# Patient Record
Sex: Male | Born: 1935 | Race: White | Hispanic: No | Marital: Married
Health system: Southern US, Community
[De-identification: ages and names within clinical notes are randomized; demographics above are authoritative.]

---

## 2009-01-11 ENCOUNTER — Emergency Department: Payer: Self-pay | Admitting: Emergency Medicine

## 2012-01-25 ENCOUNTER — Ambulatory Visit: Payer: Self-pay | Admitting: Ophthalmology

## 2012-10-11 ENCOUNTER — Ambulatory Visit: Payer: Self-pay | Admitting: Family Medicine

## 2013-03-02 ENCOUNTER — Ambulatory Visit: Payer: Self-pay

## 2013-03-02 ENCOUNTER — Emergency Department: Payer: Self-pay | Admitting: Emergency Medicine

## 2013-03-02 LAB — URINALYSIS, COMPLETE
Blood: NEGATIVE
Nitrite: NEGATIVE
Ph: 7 (ref 4.5–8.0)
RBC,UR: 1 /HPF (ref 0–5)
Specific Gravity: 1.005 (ref 1.003–1.030)
Squamous Epithelial: <1
WBC UR: 2 /HPF (ref 0–5)

## 2013-03-02 LAB — CBC
HGB: 14.4 g/dL (ref 13.0–18.0)
MCH: 31.4 pg (ref 26.0–34.0)
MCHC: 35.3 g/dL (ref 32.0–36.0)
MCV: 89 fL (ref 80–100)
Platelet: 172 10*3/uL (ref 150–440)
RBC: 4.58 10*6/uL (ref 4.40–5.90)

## 2013-03-02 LAB — BASIC METABOLIC PANEL
BUN: 15 mg/dL (ref 7–18)
Chloride: 107 mmol/L (ref 98–107)
Co2: 34 mmol/L — ABNORMAL HIGH (ref 21–32)
EGFR (African American): >60
EGFR (Non-African Amer.): >60
Glucose: 88 mg/dL (ref 65–99)
Osmolality: 282 (ref 275–301)
Potassium: 4.1 mmol/L (ref 3.5–5.1)

## 2013-03-02 LAB — TROPONIN I: Troponin-I: <0.02 ng/mL

## 2013-11-20 ENCOUNTER — Inpatient Hospital Stay: Payer: Self-pay | Admitting: Internal Medicine

## 2013-11-20 LAB — BASIC METABOLIC PANEL
Anion Gap: 5 — ABNORMAL LOW (ref 7–16)
BUN: 18 mg/dL (ref 7–18)
CALCIUM: 8.3 mg/dL — AB (ref 8.5–10.1)
CREATININE: 1.2 mg/dL (ref 0.60–1.30)
Chloride: 108 mmol/L — ABNORMAL HIGH (ref 98–107)
Co2: 30 mmol/L (ref 21–32)
EGFR (Non-African Amer.): 56 — ABNORMAL LOW
Glucose: 90 mg/dL (ref 65–99)
Osmolality: 286 (ref 275–301)
Potassium: 3.9 mmol/L (ref 3.5–5.1)
SODIUM: 143 mmol/L (ref 136–145)

## 2013-11-20 LAB — CBC WITH DIFFERENTIAL/PLATELET
Basophil #: 0.1 10*3/uL (ref 0.0–0.1)
Basophil %: 0.7 %
Eosinophil #: 0.1 10*3/uL (ref 0.0–0.7)
Eosinophil %: 0.8 %
HCT: 40.3 % (ref 40.0–52.0)
HGB: 13.5 g/dL (ref 13.0–18.0)
Lymphocyte #: 1.2 10*3/uL (ref 1.0–3.6)
Lymphocyte %: 13.1 %
MCH: 30.5 pg (ref 26.0–34.0)
MCHC: 33.6 g/dL (ref 32.0–36.0)
MCV: 91 fL (ref 80–100)
MONO ABS: 0.6 x10 3/mm (ref 0.2–1.0)
MONOS PCT: 6.9 %
Neutrophil #: 7 10*3/uL — ABNORMAL HIGH (ref 1.4–6.5)
Neutrophil %: 78.5 %
PLATELETS: 158 10*3/uL (ref 150–440)
RBC: 4.44 10*6/uL (ref 4.40–5.90)
RDW: 13.2 % (ref 11.5–14.5)
WBC: 8.9 10*3/uL (ref 3.8–10.6)

## 2013-11-20 LAB — HEPATIC FUNCTION PANEL A (ARMC)
AST: 21 U/L (ref 15–37)
Albumin: 3.3 g/dL — ABNORMAL LOW (ref 3.4–5.0)
Alkaline Phosphatase: 79 U/L
BILIRUBIN DIRECT: 0.1 mg/dL (ref 0.00–0.20)
BILIRUBIN TOTAL: 0.3 mg/dL (ref 0.2–1.0)
SGPT (ALT): 16 U/L (ref 12–78)
Total Protein: 6.2 g/dL — ABNORMAL LOW (ref 6.4–8.2)

## 2013-11-20 LAB — URINALYSIS, COMPLETE
BILIRUBIN, UR: NEGATIVE
BLOOD: NEGATIVE
Bacteria: NONE SEEN
GLUCOSE, UR: NEGATIVE mg/dL (ref 0–75)
KETONE: NEGATIVE
LEUKOCYTE ESTERASE: NEGATIVE
Nitrite: NEGATIVE
PROTEIN: NEGATIVE
Ph: 5 (ref 4.5–8.0)
RBC,UR: 1 /HPF (ref 0–5)
SPECIFIC GRAVITY: 1.021 (ref 1.003–1.030)
Squamous Epithelial: NONE SEEN

## 2013-11-20 LAB — TROPONIN I: Troponin-I: <0.02 ng/mL

## 2013-11-21 LAB — CBC WITH DIFFERENTIAL/PLATELET
BASOS ABS: 0 10*3/uL (ref 0.0–0.1)
Basophil %: 0.5 %
EOS ABS: 0.1 10*3/uL (ref 0.0–0.7)
Eosinophil %: 1.5 %
HCT: 41.4 % (ref 40.0–52.0)
HGB: 14.2 g/dL (ref 13.0–18.0)
Lymphocyte #: 1.5 10*3/uL (ref 1.0–3.6)
Lymphocyte %: 17.3 %
MCH: 30.8 pg (ref 26.0–34.0)
MCHC: 34.4 g/dL (ref 32.0–36.0)
MCV: 89 fL (ref 80–100)
Monocyte #: 0.6 x10 3/mm (ref 0.2–1.0)
Monocyte %: 7.2 %
NEUTROS PCT: 73.5 %
Neutrophil #: 6.2 10*3/uL (ref 1.4–6.5)
PLATELETS: 162 10*3/uL (ref 150–440)
RBC: 4.63 10*6/uL (ref 4.40–5.90)
RDW: 13.3 % (ref 11.5–14.5)
WBC: 8.5 10*3/uL (ref 3.8–10.6)

## 2013-11-21 LAB — BASIC METABOLIC PANEL
ANION GAP: 5 — AB (ref 7–16)
BUN: 15 mg/dL (ref 7–18)
CO2: 29 mmol/L (ref 21–32)
Calcium, Total: 8.3 mg/dL — ABNORMAL LOW (ref 8.5–10.1)
Chloride: 107 mmol/L (ref 98–107)
Creatinine: 0.89 mg/dL (ref 0.60–1.30)
EGFR (African American): >60
EGFR (Non-African Amer.): >60
Glucose: 84 mg/dL (ref 65–99)
Osmolality: 281 (ref 275–301)
POTASSIUM: 3.3 mmol/L — AB (ref 3.5–5.1)
Sodium: 141 mmol/L (ref 136–145)

## 2013-11-21 LAB — MAGNESIUM: Magnesium: 2 mg/dL

## 2013-11-21 LAB — LIPID PANEL
CHOLESTEROL: 143 mg/dL (ref 0–200)
HDL: 41 mg/dL (ref 40–60)
LDL CHOLESTEROL, CALC: 88 mg/dL (ref 0–100)
Triglycerides: 69 mg/dL (ref 0–200)
VLDL Cholesterol, Calc: 14 mg/dL (ref 5–40)

## 2013-11-21 LAB — TSH: Thyroid Stimulating Horm: 1.5 u[IU]/mL

## 2014-01-06 ENCOUNTER — Emergency Department: Payer: Self-pay | Admitting: Emergency Medicine

## 2014-01-06 LAB — URINALYSIS, COMPLETE
BACTERIA: NONE SEEN
BLOOD: NEGATIVE
Bilirubin,UR: NEGATIVE
Glucose,UR: NEGATIVE mg/dL (ref 0–75)
Hyaline Cast: 4
LEUKOCYTE ESTERASE: NEGATIVE
NITRITE: NEGATIVE
PH: 5 (ref 4.5–8.0)
PROTEIN: NEGATIVE
SPECIFIC GRAVITY: 1.025 (ref 1.003–1.030)
Squamous Epithelial: 1

## 2014-01-06 LAB — CBC
HCT: 41.2 % (ref 40.0–52.0)
HGB: 13.4 g/dL (ref 13.0–18.0)
MCH: 29.4 pg (ref 26.0–34.0)
MCHC: 32.6 g/dL (ref 32.0–36.0)
MCV: 90 fL (ref 80–100)
Platelet: 184 10*3/uL (ref 150–440)
RBC: 4.58 10*6/uL (ref 4.40–5.90)
RDW: 13.6 % (ref 11.5–14.5)
WBC: 8 10*3/uL (ref 3.8–10.6)

## 2014-01-06 LAB — BASIC METABOLIC PANEL
Anion Gap: 1 — ABNORMAL LOW (ref 7–16)
BUN: 21 mg/dL — ABNORMAL HIGH (ref 7–18)
CALCIUM: 8.4 mg/dL — AB (ref 8.5–10.1)
CO2: 32 mmol/L (ref 21–32)
CREATININE: 1.35 mg/dL — AB (ref 0.60–1.30)
Chloride: 110 mmol/L — ABNORMAL HIGH (ref 98–107)
EGFR (African American): 57 — ABNORMAL LOW
GFR CALC NON AF AMER: 49 — AB
GLUCOSE: 85 mg/dL (ref 65–99)
Osmolality: 287 (ref 275–301)
Potassium: 3.9 mmol/L (ref 3.5–5.1)
Sodium: 143 mmol/L (ref 136–145)

## 2014-10-10 DEATH — deceased

## 2014-11-01 NOTE — Discharge Summary (Signed)
PATIENT NAME:  Nathan Wheeler, Nathan Wheeler MR#:  [id number] DATE OF BIRTH:  [birth year]  DATE OF ADMISSION:  11/20/2013  DATE OF DISCHARGE:  11/21/2013  PRIMARY CARE PHYSICIAN:  Dr. Quillian QuinceBliss  DISCHARGE DIAGNOSES:  1.  Frequent falls. 2.  Parkinson's disease.  3.  Hypertension.  4.  Dementia.   CONDITION: Stable.   CODE STATUS: Full code.   MEDICATIONS: Please refer to the medication reconciliation list.   DIET: Low sodium diet.   ACTIVITY: As tolerated.   FOLLOWUP CARE: Follow up with PCP visit in 1 to 2 weeks, and fall and aspiration precautions.   REASON FOR ADMISSION: Frequent falls.   HOSPITAL COURSE: The patient is an 81 year old Caucasian male with Wheeler history of Lewy body dementia, hypertension, CAD, Parkinson's disease, who was sent from home to ED due to frequent falls recently. The patient's CAT scan of head did not show any acute changes. No obvious fracture of cervical spine. The patient's labs are normal. For detailed history and physical examination, please refer to the admission note dictated by Dr. Heron NayVasireddy. After admission, the patient had Wheeler physical therapy evaluation. I discuss with the social worker and case Production designer, theatre/television/filmmanager. They suggested patient may be discharged to home today, and then will get Wheeler skilled nursing facility from home. The patient's wife ([id number]) agreed. The patient is stable and will be discharged to home today. I discussed the patient's discharge plan with the patient's wife, nurse, case Production designer, theatre/television/filmmanager, and Child psychotherapistsocial worker.   TIME SPENT: About 42 minutes.   ____________________________ Shaune PollackQing Anabia Weatherwax, MD qc:mr D: 11/21/2013 17:21:00 ET T: 11/21/2013 19:18:05 ET JOB#: [id number]  cc: Shaune PollackQing Aneya Daddona, MD, <Dictator> Shaune PollackQING Woods Gangemi MD ELECTRONICALLY SIGNED 11/22/2013 14:52

## 2014-11-01 NOTE — H&P (Signed)
PATIENT NAME:  Nathan Wheeler, Nathan Wheeler MR#:  [id number] DATE OF BIRTH:  [birth year]  DATE OF ADMISSION:  11/21/2013  PRIMARY CARE PHYSICIAN: Dr. Temple Pacini.   REFERRING PHYSICIAN: Dr. Ella Bodo.   CHIEF COMPLAINT: Frequent falls.   HISTORY OF PRESENT ILLNESS: Nathan Wheeler is an 81 year old male with history of Lewy body dementia, hypertension, Parkinson disease. He is brought to the Emergency Department with complaints of frequent falls. The patient usually falls once in 1 to 2 weeks. It has become every week, and in the last few days, the patient has been falling 2 to 3 times a day. The patient's wife is unable to provide any care for this patient. The patient's wife was not present at the time of my interview. Discussed with the Emergency Department physician. Expressed interest in placing in a skilled nursing facility. The patient denied having any chest pain, palpitations, nausea, vomiting, abdominal pain currently. Knows that this a hospital; however, did not know the reason of the ER visit. Workup in the Emergency Department: CBC, CMP, cardiac enzymes, urinalysis are completely within normal limits. CT head without contrast shows diffuse cerebral atrophy. No obvious fractures of the cervical spine.   PAST MEDICAL HISTORY:  1. Lewy body dementia.  2. Hypertension.  3. Coronary artery disease, status post stent placement.  4. Fusion of the bilateral ankles.  5. Bilateral knee replacement.    ALLERGIES:  1. PENICILLIN.  2. TETANUS.   3. PERCOCET.  4. OXYCONTIN.   HOME MEDICATIONS:  1. .  2. Proscar.  3. .   4. Omeprazole.  5. Florinef.   6. Aricept.   SOCIAL HISTORY: No history of smoking, drinking alcohol or using illicit drugs. Married, lives with his wife.   FAMILY HISTORY: Could not be obtained from the patient.   REVIEW OF SYSTEMS: Could not be obtained from the patient secondary to dementia.   PHYSICAL EXAMINATION:  GENERAL: This is a well-built, well-nourished, age-appropriate  male lying down in the bed, not in distress.  VITAL SIGNS: Temperature 97.7, pulse 60, blood pressure 156/95, respiratory rate of 18, oxygen saturation is 97% on room air.  HEENT: Head normocephalic, atraumatic. There is no scleral icterus. Conjunctivae normal. Pupils equal and react to light. Could not examine the extraocular movements. Mucous membranes moist. No pharyngeal erythema.  NECK: Supple. No lymphadenopathy. No JVD. No carotid bruit. No thyromegaly.  CHEST: Has no focal tenderness.  LUNGS: Bilaterally clear to auscultation.  HEART: S1, S2 regular. No murmurs are heard.  ABDOMEN: Bowel sounds present. Soft, nontender, nondistended. No hepatosplenomegaly.  EXTREMITIES: No pedal edema. Pulses 2+.  SKIN: No rash or lesions.  MUSCULOSKELETAL: Good range of motion in all of the extremities.  NEUROLOGIC: The patient is alert, oriented to self. [name] this is a hospital. Did not remember the name of the hospital. Was able to tell his date of birth. Not oriented to time and date. Cranial nerves II through XII intact. Motor 5/5 in upper and lower extremities.   ASSESSMENT AND PLAN: [name] is an 81 year old male with a history of advanced dementia secondary to Lewy body dementia. Comes to the Emergency Department with frequent falls.  1. Frequent falls: Concern about the patient's balance secondary to Parkinson disease. The patient is not on any anti-Parkinson disease . The patient does not have any resting tremor. Did not see much rigidity; however, will admit the patient and obtain physical therapy, occupational therapy evaluation. Will also get case management consult for possible placement.  2.  to be progressive with worsening of the symptoms.  3. Hypertension: Currently well controlled. He is not on any medications.  4. Keep the patient on deep vein thrombosis prophylaxis with Lovenox.   TIME SPENT: 45 minutes.   ____________________________ Susa GriffinsPadmaja Cylas Falzone, MD pv:gb D: 11/20/2013  23:57:07 ET T: 11/21/2013 00:25:32 ET JOB#: [id number]  cc: Susa GriffinsPadmaja Alvis Edgell, MD, <Dictator> Burley SaverL. Katherine Bliss, MD Susa GriffinsPADMAJA Najah Liverman MD ELECTRONICALLY SIGNED 12/06/2013 0:36

## 2015-12-18 IMAGING — CT CT CERVICAL SPINE WITHOUT CONTRAST
2 of 3 series · 13 of 27 positions shown, 16 images · non-contrast
Comparison: DG CHEST 2V dated 11/20/2013

CLINICAL DATA: Fell tonight at time. No loss of consciousness. No
obvious injuries.

EXAM:
CT HEAD WITHOUT CONTRAST
CT CERVICAL SPINE WITHOUT CONTRAST
TECHNIQUE: Multidetector CT imaging of the head and cervical spine was
performed following the standard protocol without intravenous
contrast. Multiplanar CT image reconstructions of the cervical spine
were also generated.

[Series 5: soft tissue · axial · 0.39mm/px · z∈[-141,-5]mm · 8 of 88 slices shown, 10 images]
[im 10/88  soft-tissue]
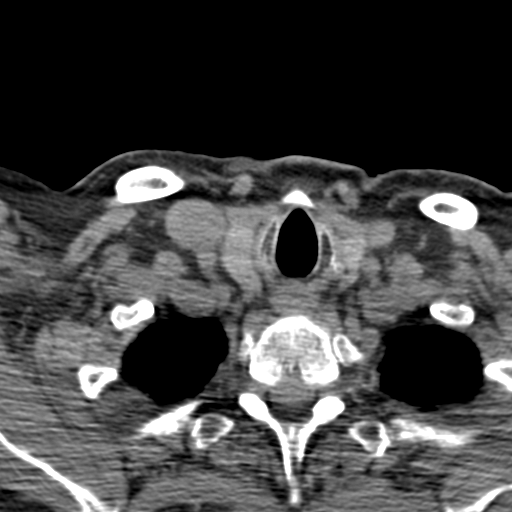
[im 10/88  bone]
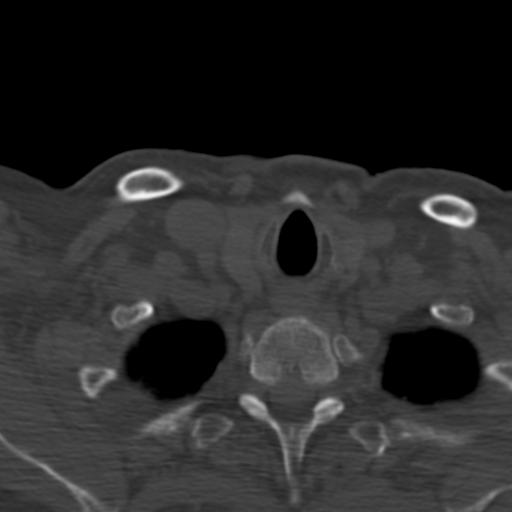
[im 20/88  bone]
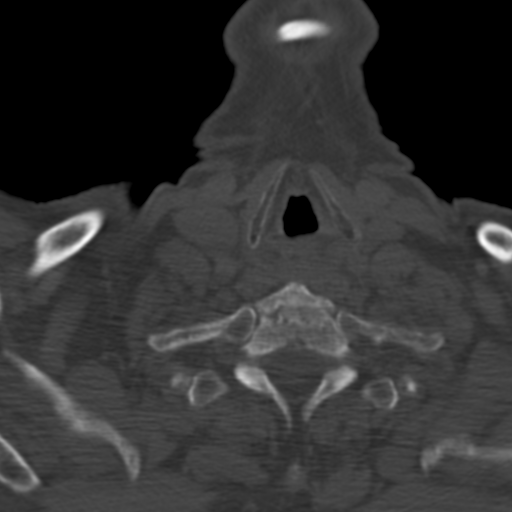
[im 30/88  bone]
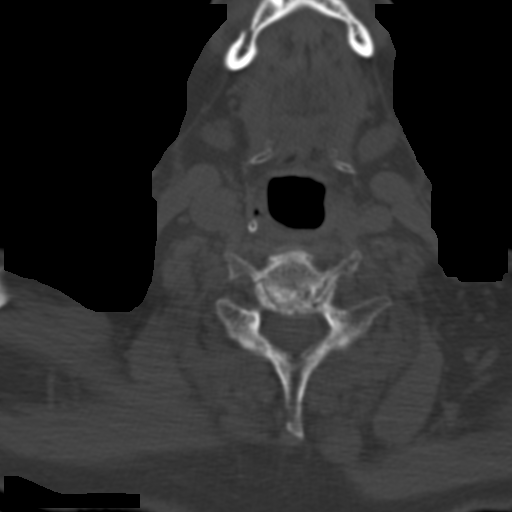
[im 39/88  bone]
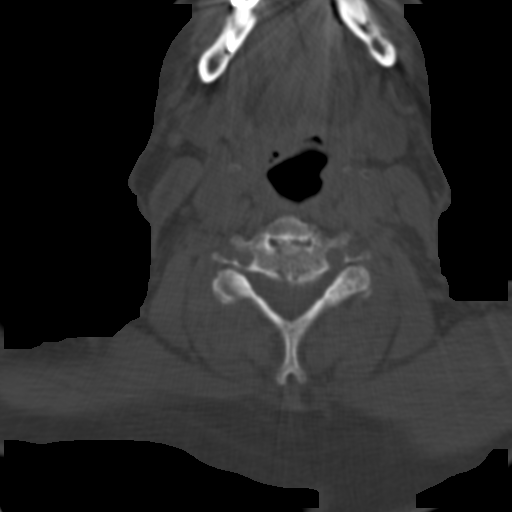
[im 49/88  soft-tissue]
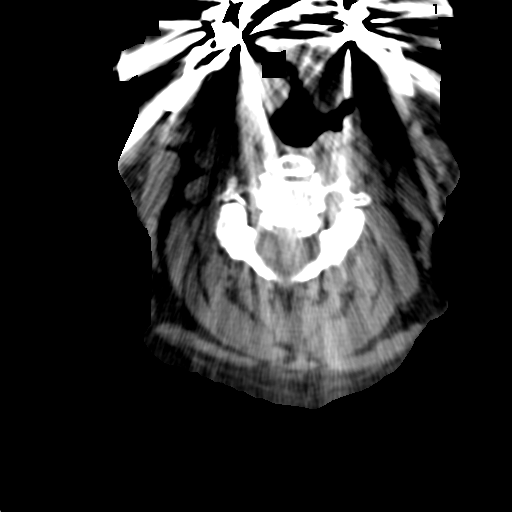
[im 49/88  bone]
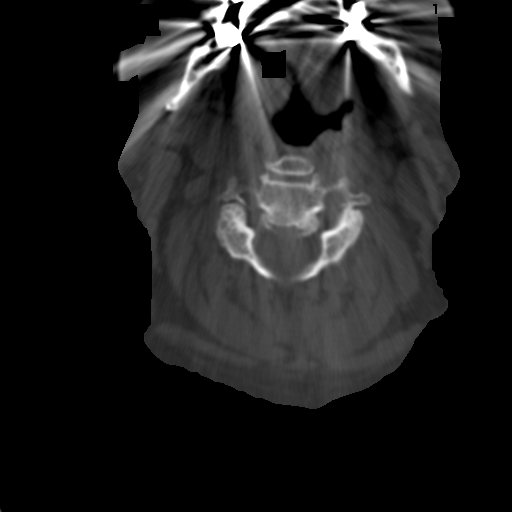
[im 59/88  bone]
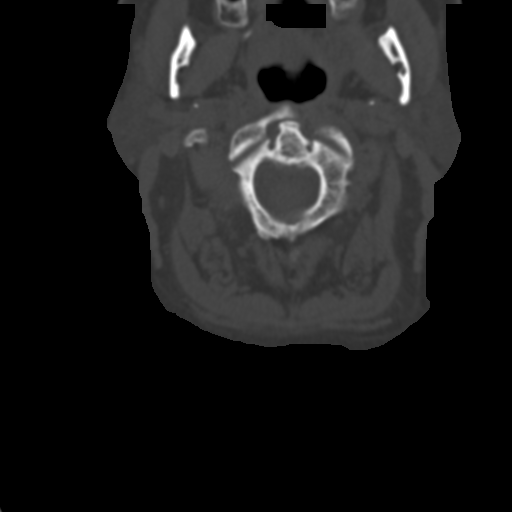
[im 68/88  bone]
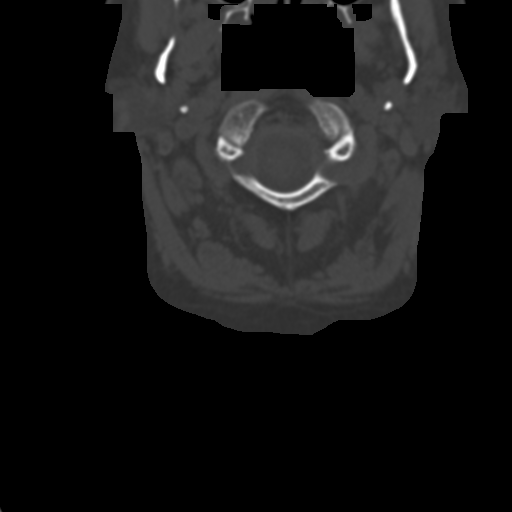
[im 78/88  bone]
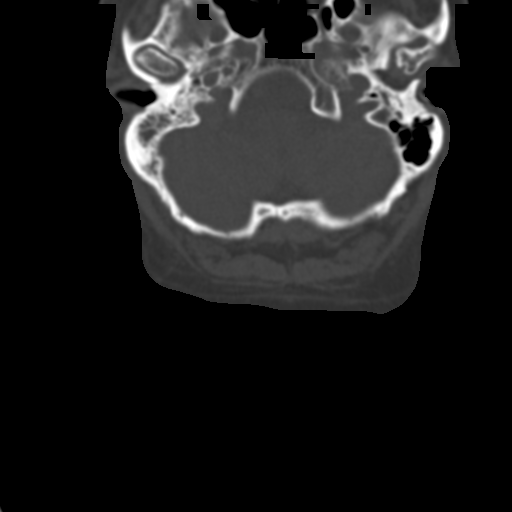

[Series 6: sagittal bone · sagittal · 0.38mm/px · 5 of 53 slices shown, 6 images]
[im 18/53  bone]
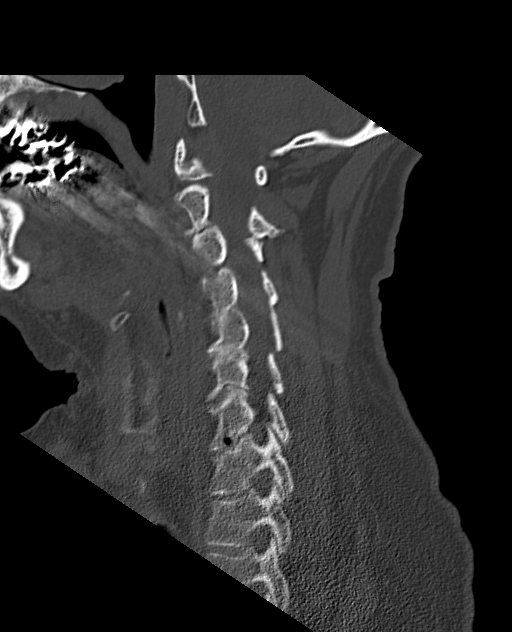
[im 22/53  bone]
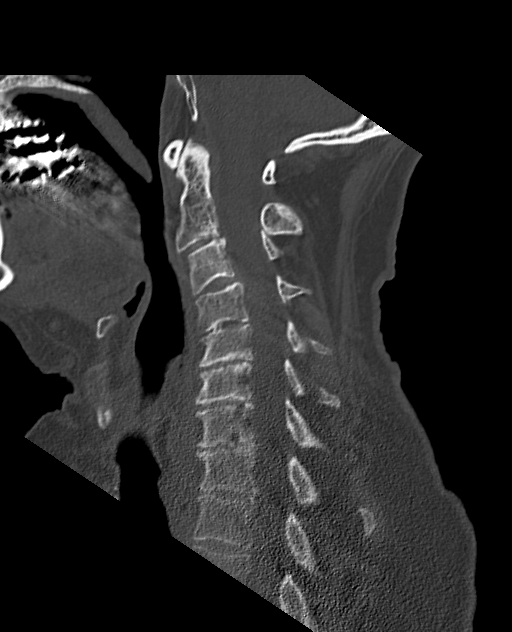
[im 27/53  soft-tissue]
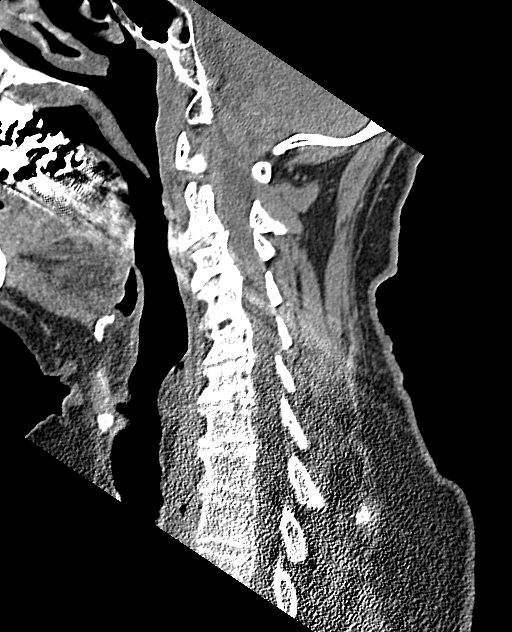
[im 27/53  bone]
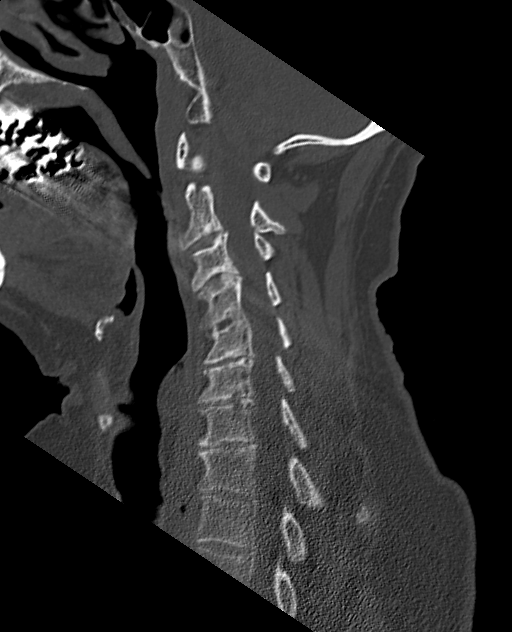
[im 31/53  bone]
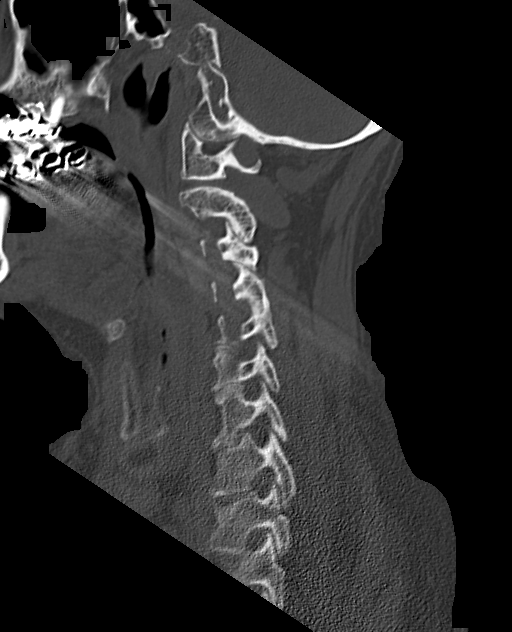
[im 35/53  bone]
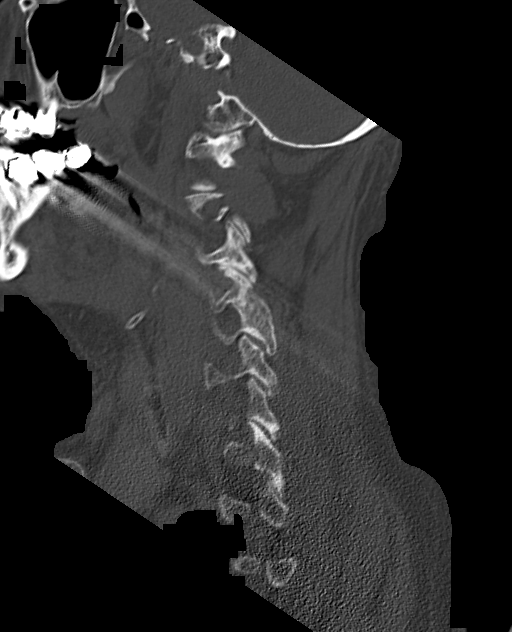

[13 of 27 positions shown; findings below may reference images not displayed]

FINDINGS: CT HEAD FINDINGS

Diffuse cerebral atrophy. Mild ventricular dilatation consistent
with central atrophy. Low-attenuation changes demonstrated in the
deep white matter consistent with small vessel ischemia. Old lacunar
infarcts in the basal ganglia bilaterally. No mass effect or midline
shift. No abnormal extra-axial fluid collections. Gray-white matter
junctions are distinct. Basal cisterns are not effaced. No evidence
of acute intracranial hemorrhage. No depressed skull fractures.
Opacification of the right mastoid air cells and right middle ear
cavity. Visualized paranasal sinuses are clear. Vascular
calcifications.

CT CERVICAL SPINE FINDINGS

Diffuse degenerative changes throughout the cervical spine with
narrowed interspaces and associated endplate hypertrophic changes.
Degenerative changes in the facet joints. There is reversal of the
usual cervical lordosis with slight anterior subluxation of C3 on
C4. These changes are likely positional/degenerative. Ligamentous
injury or muscle spasm could also have this appearance and are not
excluded however. No vertebral compression deformities. No
prevertebral soft tissue swelling. C1-2 articulation appears intact.
No focal bone lesion or bone destruction.
IMPRESSION: No acute intracranial abnormalities. Chronic atrophy and small
vessel ischemic changes. Opacification of right mastoid air cells
and middle ear.

Diffuse degenerative change throughout the cervical spine.
Nonspecific reversal of the usual cervical lordosis. No displaced
fractures identified.

## 2016-02-03 IMAGING — CT CT HEAD WITHOUT CONTRAST
1 series · 16 of 30 positions shown, 20 images · non-contrast
Comparison: Noncontrast CT scan of the brain dated November 20, 2013.

CLINICAL DATA: Unwitnessed fall with abrasion of the forehead and
nose.

EXAM:
CT HEAD WITHOUT CONTRAST
TECHNIQUE: Contiguous axial images were obtained from the base of the skull
through the vertex without intravenous contrast.

[Series 2: head wo · axial · 0.46mm/px · z∈[-149,-23]mm · 16 of 32 slices shown, 20 images]
[im 2/32  brain]
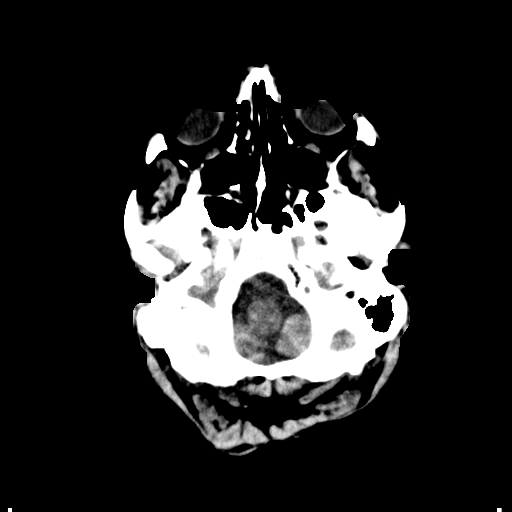
[im 2/32  bone]
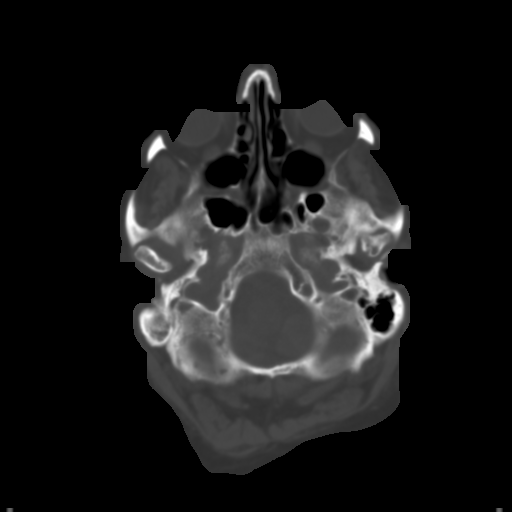
[im 4/32  brain]
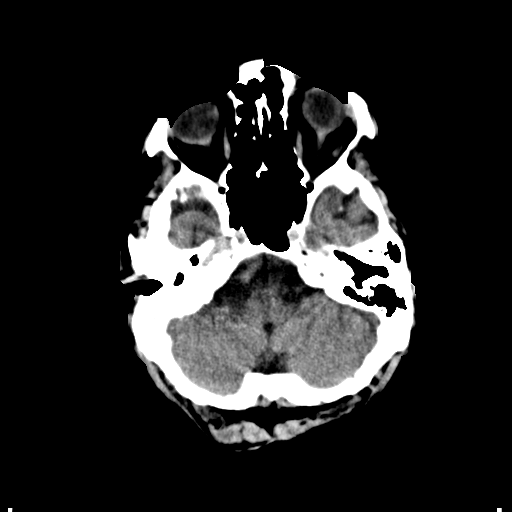
[im 6/32  brain]
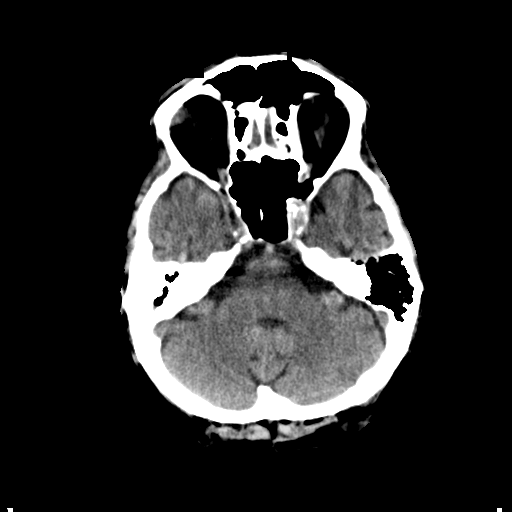
[im 8/32  brain]
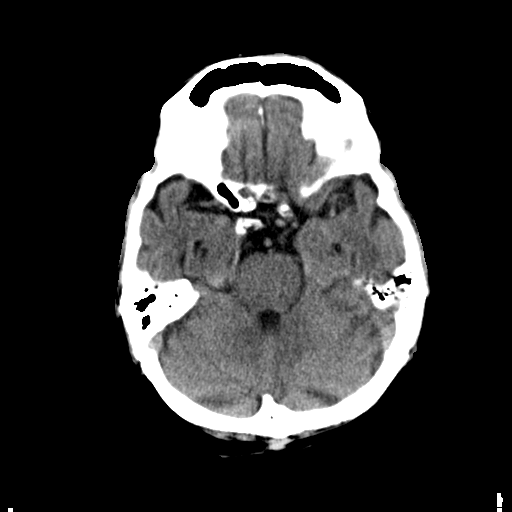
[im 9/32  brain]
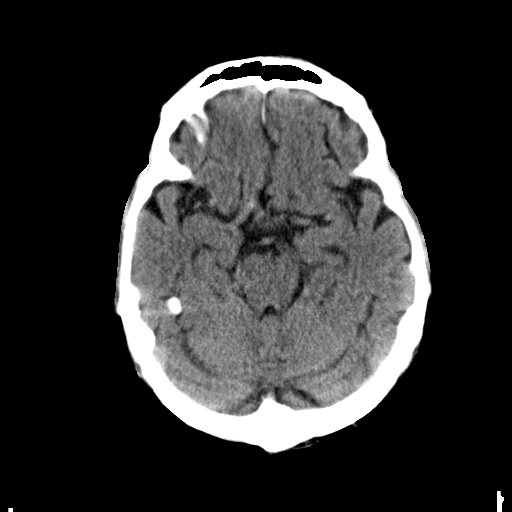
[im 9/32  bone]
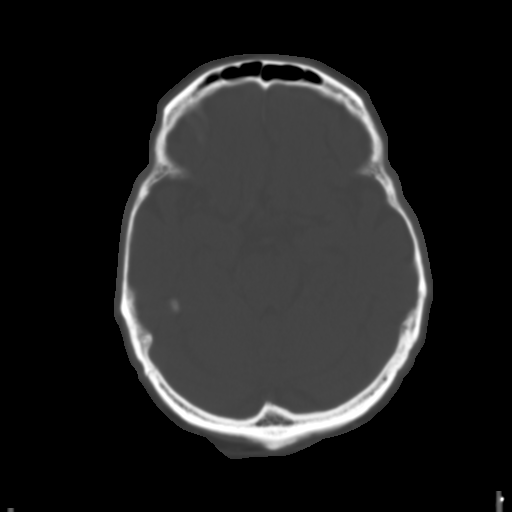
[im 11/32  brain]
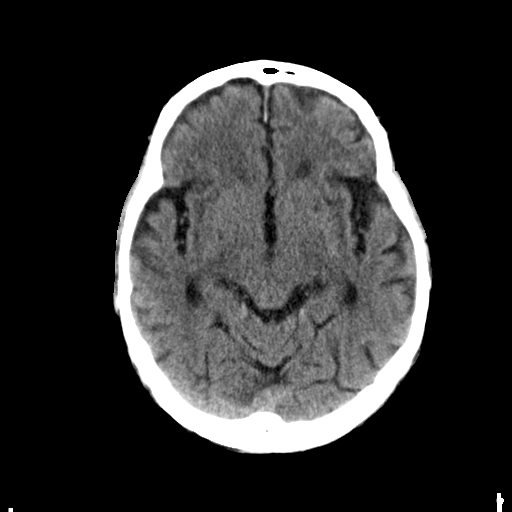
[im 13/32  brain]
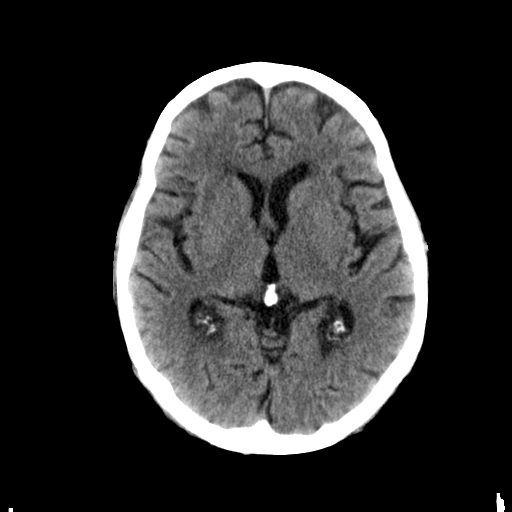
[im 15/32  brain]
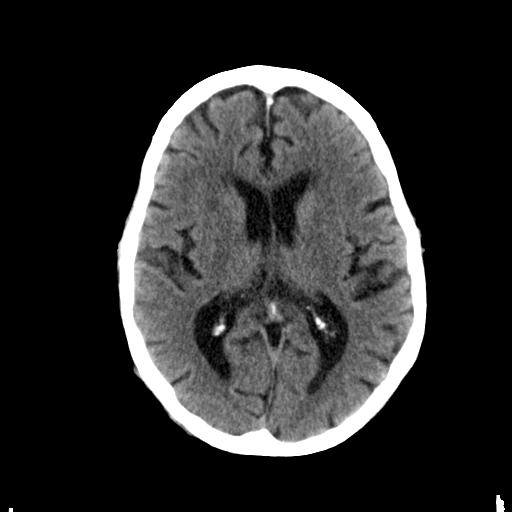
[im 17/32  brain]
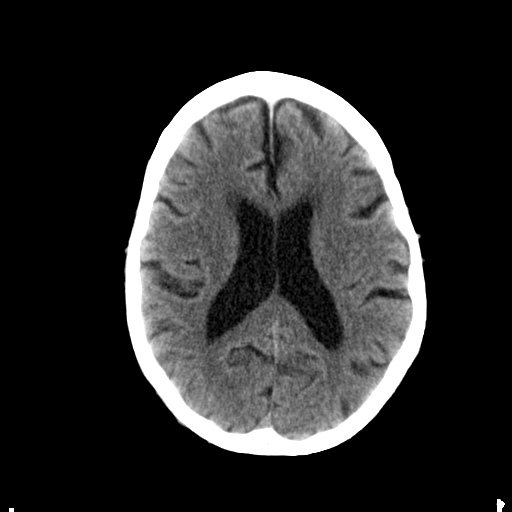
[im 17/32  bone]
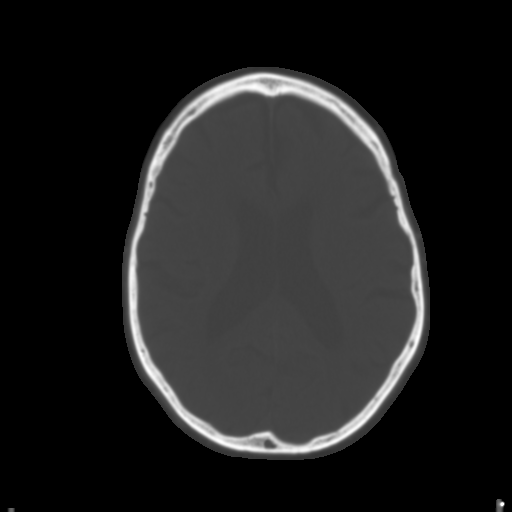
[im 19/32  brain]
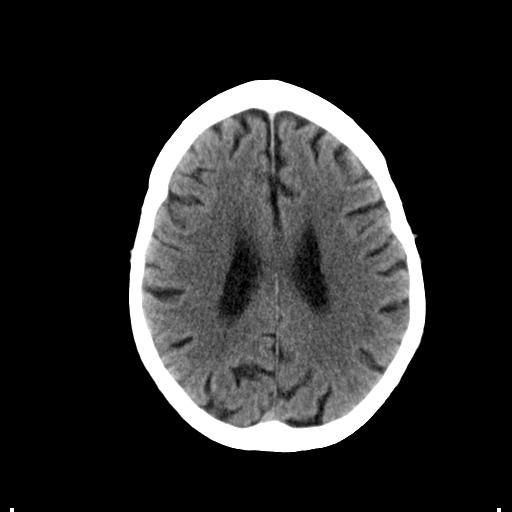
[im 21/32  brain]
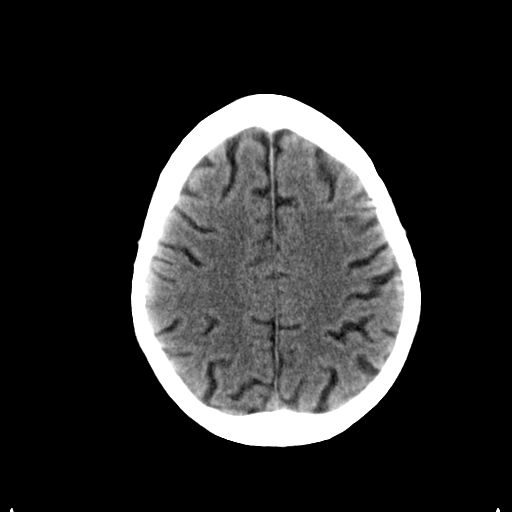
[im 23/32  brain]
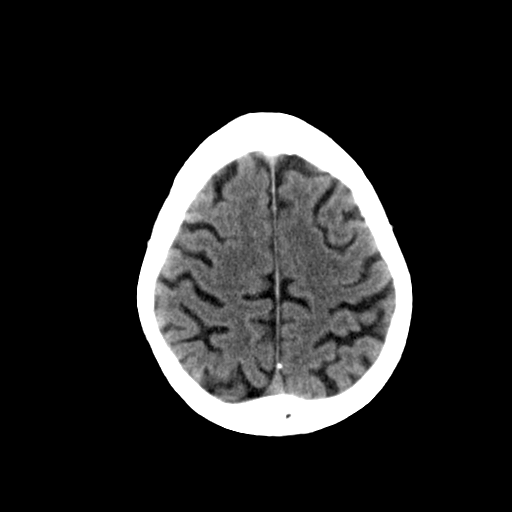
[im 24/32  brain]
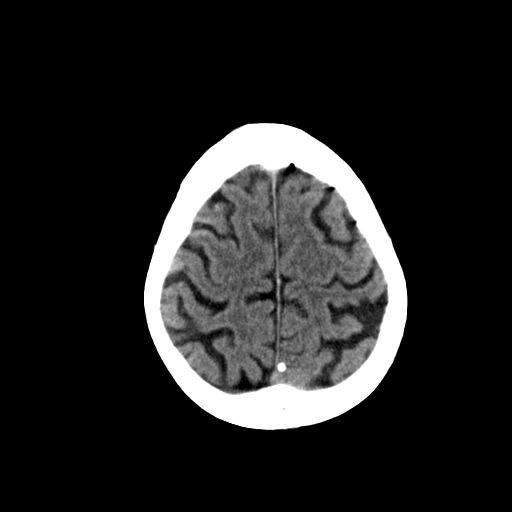
[im 24/32  bone]
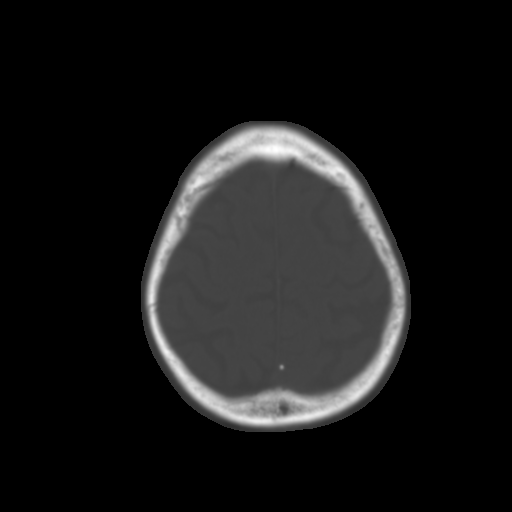
[im 26/32  brain]
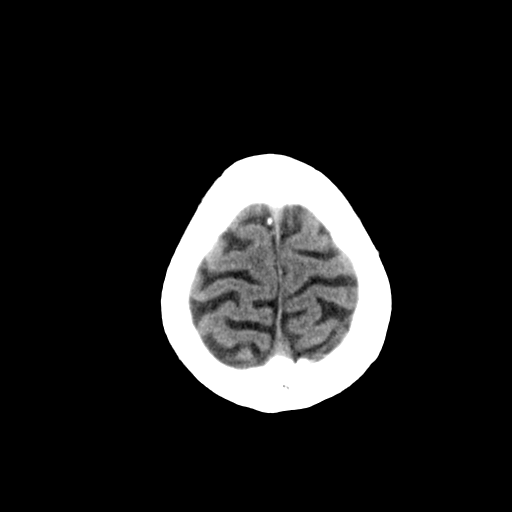
[im 28/32  brain]
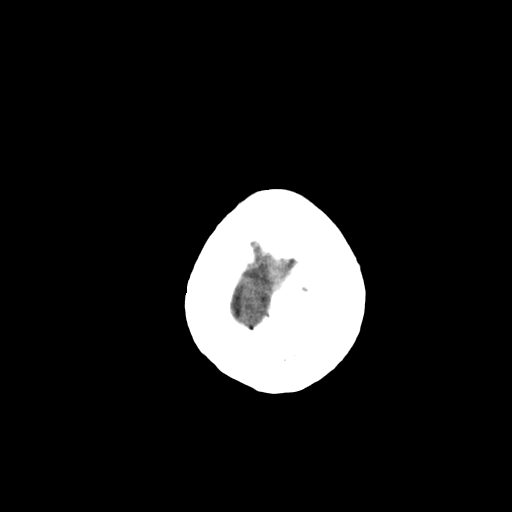
[im 30/32  brain]
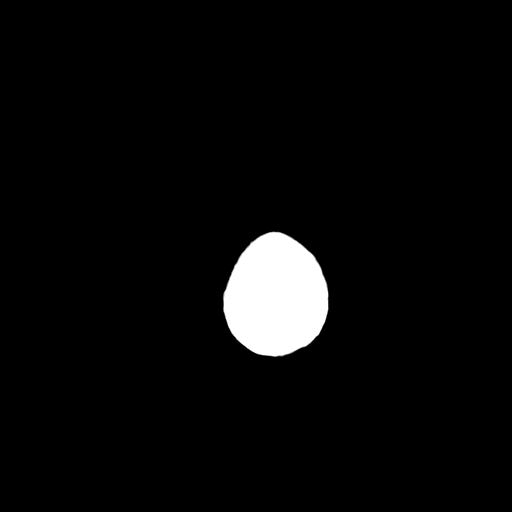

[16 of 30 positions shown; findings below may reference images not displayed]

FINDINGS: There is mild age appropriate diffuse cerebral and cerebellar
atrophy with compensatory ventriculomegaly. There is no intracranial
hemorrhage nor intracranial mass effect. No acute ischemic change is
demonstrated. The cerebellum and brainstem are normal in density.

The paranasal sinuses and left mastoid air cells are clear. There is
opacification of most of the right mastoid air cells and there is
soft tissue density in the middle ear cavity.
IMPRESSION: 1. There is no acute intracranial hemorrhage nor other acute
abnormality of the brain
2. There is no acute skull fracture.
3. Opacification of most of the right mastoid air cells and of
portions of the middle ear cavity may reflect inflammation.
